# Patient Record
Sex: Female | Born: 1955 | Race: Black or African American | Hispanic: No | Marital: Single | Smoking: Current every day smoker
Health system: Southern US, Community
[De-identification: ages and names within clinical notes are randomized; demographics above are authoritative.]

## PROBLEM LIST (undated history)

## (undated) DIAGNOSIS — J45909 Unspecified asthma, uncomplicated: Secondary | ICD-10-CM

## (undated) DIAGNOSIS — J40 Bronchitis, not specified as acute or chronic: Secondary | ICD-10-CM

## (undated) HISTORY — PX: APPENDECTOMY: SHX54

---

## 2016-07-31 ENCOUNTER — Emergency Department (HOSPITAL_COMMUNITY): Payer: Self-pay

## 2016-07-31 ENCOUNTER — Emergency Department (HOSPITAL_COMMUNITY)
Admission: EM | Admit: 2016-07-31 | Discharge: 2016-07-31 | Disposition: A | Discharge status: Home / Self Care | Payer: Self-pay | Attending: Emergency Medicine | Admitting: Emergency Medicine

## 2016-07-31 ENCOUNTER — Encounter (HOSPITAL_COMMUNITY): Payer: Self-pay | Admitting: Emergency Medicine

## 2016-07-31 DIAGNOSIS — F17210 Nicotine dependence, cigarettes, uncomplicated: Secondary | ICD-10-CM | POA: Insufficient documentation

## 2016-07-31 DIAGNOSIS — Z91040 Latex allergy status: Secondary | ICD-10-CM | POA: Insufficient documentation

## 2016-07-31 DIAGNOSIS — R112 Nausea with vomiting, unspecified: Secondary | ICD-10-CM | POA: Insufficient documentation

## 2016-07-31 DIAGNOSIS — J45909 Unspecified asthma, uncomplicated: Secondary | ICD-10-CM | POA: Insufficient documentation

## 2016-07-31 HISTORY — DX: Unspecified asthma, uncomplicated: J45.909

## 2016-07-31 HISTORY — DX: Bronchitis, not specified as acute or chronic: J40

## 2016-07-31 LAB — CBC
HEMATOCRIT: 36.4 % (ref 36.0–46.0)
HEMOGLOBIN: 12.6 g/dL (ref 12.0–15.0)
MCH: 32.8 pg (ref 26.0–34.0)
MCHC: 34.6 g/dL (ref 30.0–36.0)
MCV: 94.8 fL (ref 78.0–100.0)
PLATELETS: 276 10*3/uL (ref 150–400)
RBC: 3.84 MIL/uL — AB (ref 3.87–5.11)
RDW: 12.4 % (ref 11.5–15.5)
WBC: 5.5 10*3/uL (ref 4.0–10.5)

## 2016-07-31 LAB — BASIC METABOLIC PANEL
ANION GAP: 13 (ref 5–15)
BUN: 9 mg/dL (ref 6–20)
CHLORIDE: 100 mmol/L — AB (ref 101–111)
CO2: 20 mmol/L — AB (ref 22–32)
CREATININE: 0.72 mg/dL (ref 0.44–1.00)
Calcium: 9.3 mg/dL (ref 8.9–10.3)
GFR calc non Af Amer: >60 mL/min (ref >60)
Glucose, Bld: 128 mg/dL — ABNORMAL HIGH (ref 65–99)
POTASSIUM: 3.3 mmol/L — AB (ref 3.5–5.1)
SODIUM: 133 mmol/L — AB (ref 135–145)

## 2016-07-31 LAB — URINALYSIS, ROUTINE W REFLEX MICROSCOPIC
BILIRUBIN URINE: NEGATIVE
Glucose, UA: NEGATIVE mg/dL
Hgb urine dipstick: NEGATIVE
KETONES UR: 20 mg/dL — AB
Leukocytes, UA: NEGATIVE
NITRITE: NEGATIVE
PH: 8 (ref 5.0–8.0)
Protein, ur: NEGATIVE mg/dL
Specific Gravity, Urine: 1.012 (ref 1.005–1.030)

## 2016-07-31 LAB — CBG MONITORING, ED: GLUCOSE-CAPILLARY: 128 mg/dL — AB (ref 65–99)

## 2016-07-31 LAB — TROPONIN I
Troponin I: <0.03 ng/mL (ref <0.03)
Troponin I: <0.03 ng/mL (ref <0.03)

## 2016-07-31 LAB — LIPASE, BLOOD: Lipase: 17 U/L (ref 11–51)

## 2016-07-31 MED ORDER — ONDANSETRON HCL 4 MG/2ML IJ SOLN
4.0000 mg | Freq: Once | INTRAMUSCULAR | Status: AC
  Administered 2016-07-31: 4 mg via INTRAVENOUS
  Filled 2016-07-31: qty 2

## 2016-07-31 MED ORDER — SODIUM CHLORIDE 0.9 % IV BOLUS (SEPSIS)
1000.0000 mL | Freq: Once | INTRAVENOUS | Status: AC
  Administered 2016-07-31: 1000 mL via INTRAVENOUS

## 2016-07-31 MED ORDER — GI COCKTAIL ~~LOC~~
30.0000 mL | Freq: Once | ORAL | Status: AC
  Administered 2016-07-31: 30 mL via ORAL
  Filled 2016-07-31: qty 30

## 2016-07-31 NOTE — ED Provider Notes (Signed)
[facility]-EMERGENCY DEPT Provider Note   CSN: [id number] Arrival date & time: 07/31/16  0411     History   Chief Complaint Chief Complaint  Patient presents with  . Emesis  . Shortness of Breath  . Weakness    HPI Tara Moore is a 60 y.o. female.  Patient presents via EMS with nausea and vomiting that woke her from sleep. States she has been vomiting about 3 times today and felt too weak to get out of bed so she called EMS. Denies diarrhea. Denies fever but has had chills. Has had a cough and cold for the past 2 weeks and been taking Mucinex. She denies any chest pain shortness of breath abdominal pain, back pain. Denies any hematuria or dysuria. Denies sick contacts. Denies any recent travel. Denies any suspicious food intake. Previous history of appendectomy. Also history of bronchitis. EMS concerned for peak T waves on EKG.   The history is provided by the patient and the EMS personnel.  Emesis   Associated symptoms include abdominal pain, arthralgias and myalgias. Pertinent negatives include no diarrhea, no fever and no headaches.  Shortness of Breath  Associated symptoms include vomiting and abdominal pain. Pertinent negatives include no fever, no headaches, no rhinorrhea, no neck pain and no chest pain.  Weakness  Primary symptoms include no dizziness. Associated symptoms include shortness of breath and vomiting. Pertinent negatives include no chest pain and no headaches.    Past Medical History:  Diagnosis Date  . Asthma   . Bronchitis     There are no active problems to display for this patient.   Past Surgical History:  Procedure Laterality Date  . APPENDECTOMY      OB History    No data available       Home Medications    Prior to Admission medications   Not on File    Family History No family history on file.  Social History Social History  Substance Use Topics  . Smoking status: Current Every Day Smoker    Packs/day: 0.50    Types:  Cigarettes  . Smokeless tobacco: Never Used  . Alcohol use 1.2 oz/week    2 Cans of beer per week     Comment: occasionally     Allergies   Latex   Review of Systems Review of Systems  Constitutional: Positive for activity change, appetite change and fatigue. Negative for fever.  HENT: Negative for congestion and rhinorrhea.   Eyes: Negative for photophobia.  Respiratory: Positive for shortness of breath.   Cardiovascular: Negative for chest pain.  Gastrointestinal: Positive for abdominal pain, nausea and vomiting. Negative for diarrhea.  Genitourinary: Negative for dysuria and hematuria.  Musculoskeletal: Positive for arthralgias and myalgias. Negative for neck pain and neck stiffness.  Skin: Negative for wound.  Neurological: Positive for weakness. Negative for dizziness and headaches.   A complete 10 system review of systems was obtained and all systems are negative except as noted in the HPI and PMH.    Physical Exam Updated Vital Signs BP 185/99 (BP Location: Right Arm)   Pulse 70   Temp 97.6 F (36.4 C) (Oral)   Resp 25   Ht 5\' 9"  (1.753 m)   Wt 125 lb (56.7 kg)   SpO2 100%   BMI 18.46 kg/m   Physical Exam  Constitutional: She is oriented to person, place, and time. She appears well-developed and well-nourished. No distress.  HENT:  Head: Normocephalic and atraumatic.  Mouth/Throat: Oropharynx is clear and moist.  No oropharyngeal exudate.  Moist mucus membranes  Eyes: Conjunctivae and EOM are normal. Pupils are equal, round, and reactive to light.  Neck: Normal range of motion. Neck supple.  No meningismus.  Cardiovascular: Normal rate, regular rhythm, normal heart sounds and intact distal pulses.   No murmur heard. Pulmonary/Chest: Effort normal and breath sounds normal. No respiratory distress.  Abdominal: Soft. There is no tenderness. There is no rebound and no guarding.  Musculoskeletal: Normal range of motion. She exhibits no edema or tenderness.    Neurological: She is alert and oriented to person, place, and time. No cranial nerve deficit. She exhibits normal muscle tone. Coordination normal.   5/5 strength throughout. CN 2-12 intact.Equal grip strength.   Skin: Skin is warm.  Psychiatric: She has a normal mood and affect. Her behavior is normal.  Nursing note and vitals reviewed.    ED Treatments / Results  Labs (all labs ordered are listed, but only abnormal results are displayed) Labs Reviewed  BASIC METABOLIC PANEL - Abnormal; Notable for the following:       Result Value   Sodium 133 (*)    Potassium 3.3 (*)    Chloride 100 (*)    CO2 20 (*)    Glucose, Bld 128 (*)    All other components within normal limits  CBC - Abnormal; Notable for the following:    RBC 3.84 (*)    All other components within normal limits  URINALYSIS, ROUTINE W REFLEX MICROSCOPIC - Abnormal; Notable for the following:    Color, Urine STRAW (*)    Ketones, ur 20 (*)    All other components within normal limits  CBG MONITORING, ED - Abnormal; Notable for the following:    Glucose-Capillary 128 (*)    All other components within normal limits  LIPASE, BLOOD  TROPONIN I  TROPONIN I    EKG  EKG Interpretation  Date/Time:  Saturday July 31 2016 08:46:17 EST Ventricular Rate:  66 PR Interval:    QRS Duration: 87 QT Interval:  424 QTC Calculation: 445 R Axis:   63 Text Interpretation:  Sinus rhythm Normal ECG Confirmed by Gerhard Munch  MD ([id number]) on 07/31/2016 8:51:39 AM       Radiology Dg Chest 2 View  Result Date: 07/31/2016 CLINICAL DATA:  Shortness of breath and nausea and vomiting EXAM: CHEST  2 VIEW COMPARISON:  None. FINDINGS: Cardiomediastinal contours are normal. There is bibasilar atelectasis. No focal airspace consolidation or pulmonary edema. No pleural effusion or pneumothorax. IMPRESSION: No active cardiopulmonary disease. Electronically Signed   By: Deatra Robinson M.D.   On: 07/31/2016 06:04     Procedures Procedures (including critical care time)  Medications Ordered in ED Medications  sodium chloride 0.9 % bolus 1,000 mL (not administered)  ondansetron (ZOFRAN) injection 4 mg (not administered)     Initial Impression / Assessment and Plan / ED Course  I have reviewed the triage vital signs and the nursing notes.  Pertinent labs & imaging results that were available during my care of the patient were reviewed by me and considered in my medical decision making (see chart for details).  Clinical Course   Patient with vomiting and nausea and generalized weakness with onset this evening. Denies pain.  Patient given IV fluids and IV antiemetics. Her lab work is reassuring. She has no abdominal pain.  She is tolerating by mouth. On recheck she complains of some chest tightness. First troponin is negative. We'll obtain some second troponin and repeat  EKG. BP elevated without history of same. She has been recently using mucinex.  Suspect chest tightness due to GI issues. Repeat EKG obtained. Second troponin pending.  PAtient tolerating PO. She is aware of elevated BP.  Dr. Jeraldine LootsLockwood to assume care and discharge pending second troponin.  Final Clinical Impressions(s) / ED Diagnoses   Final diagnoses:  Non-intractable vomiting with nausea, unspecified vomiting type    New Prescriptions New Prescriptions   No medications on file     Glynn OctaveStephen Lamarius Dirr, MD 07/31/16 [phone]

## 2016-07-31 NOTE — Discharge Instructions (Signed)
As discussed, your evaluation today has been largely reassuring.  But, it is important that you monitor your condition carefully, and do not hesitate to return to the ED if you develop new, or concerning changes in your condition. ? ?Otherwise, please follow-up with your physician for appropriate ongoing care. ? ?

## 2016-07-31 NOTE — ED Notes (Signed)
Lab contacted to add on troponin

## 2016-07-31 NOTE — ED Notes (Signed)
Pt informed of the need for a urine specimen

## 2016-07-31 NOTE — ED Triage Notes (Signed)
Per [facility]   Pt coming from home.   Cold symptoms 1.5 weeks ago. Mucinex did not help. N/V started last night. Fever for the last several days.Chills started today and feeling like she couldn't do anything and inc weakness.. No LOC. Pt refused Zofran with EMS.  12 lead showed peaked T waves.

## 2016-07-31 NOTE — ED Notes (Signed)
Papers reviewed and paper scrubs given to patient. IV removed and patient going home with family member

## 2016-07-31 NOTE — ED Notes (Signed)
Pt refused to use wheel chair to go the restroom

## 2016-07-31 NOTE — ED Notes (Signed)
Checked CBG 128, RN Jazmine informed

## 2017-06-28 IMAGING — CR DG CHEST 2V
2 series · 2 of 2 positions shown · non-contrast
Comparison: None.

CLINICAL DATA: Shortness of breath and nausea and vomiting

EXAM:
CHEST  2 VIEW

[chest lat]
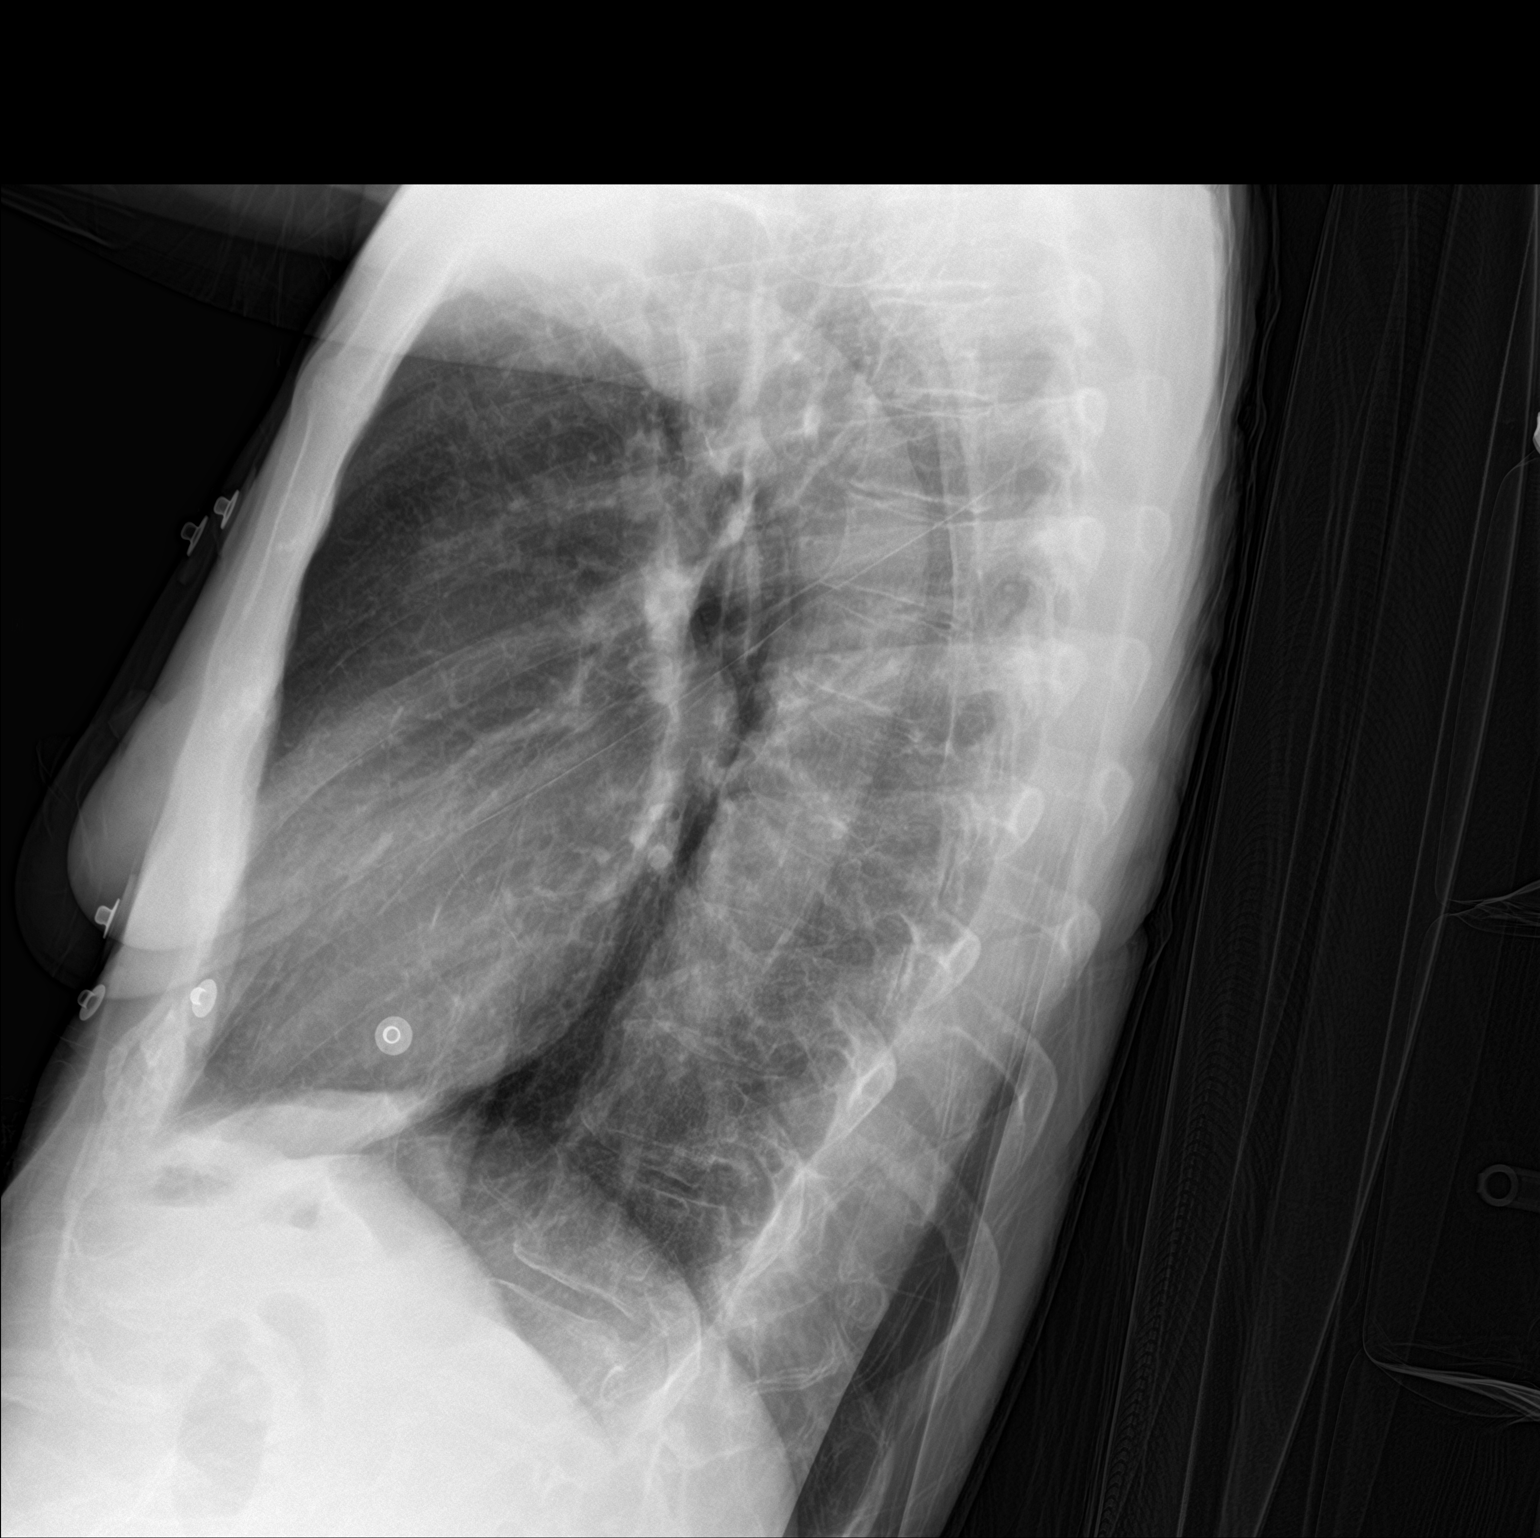

[chest ap]
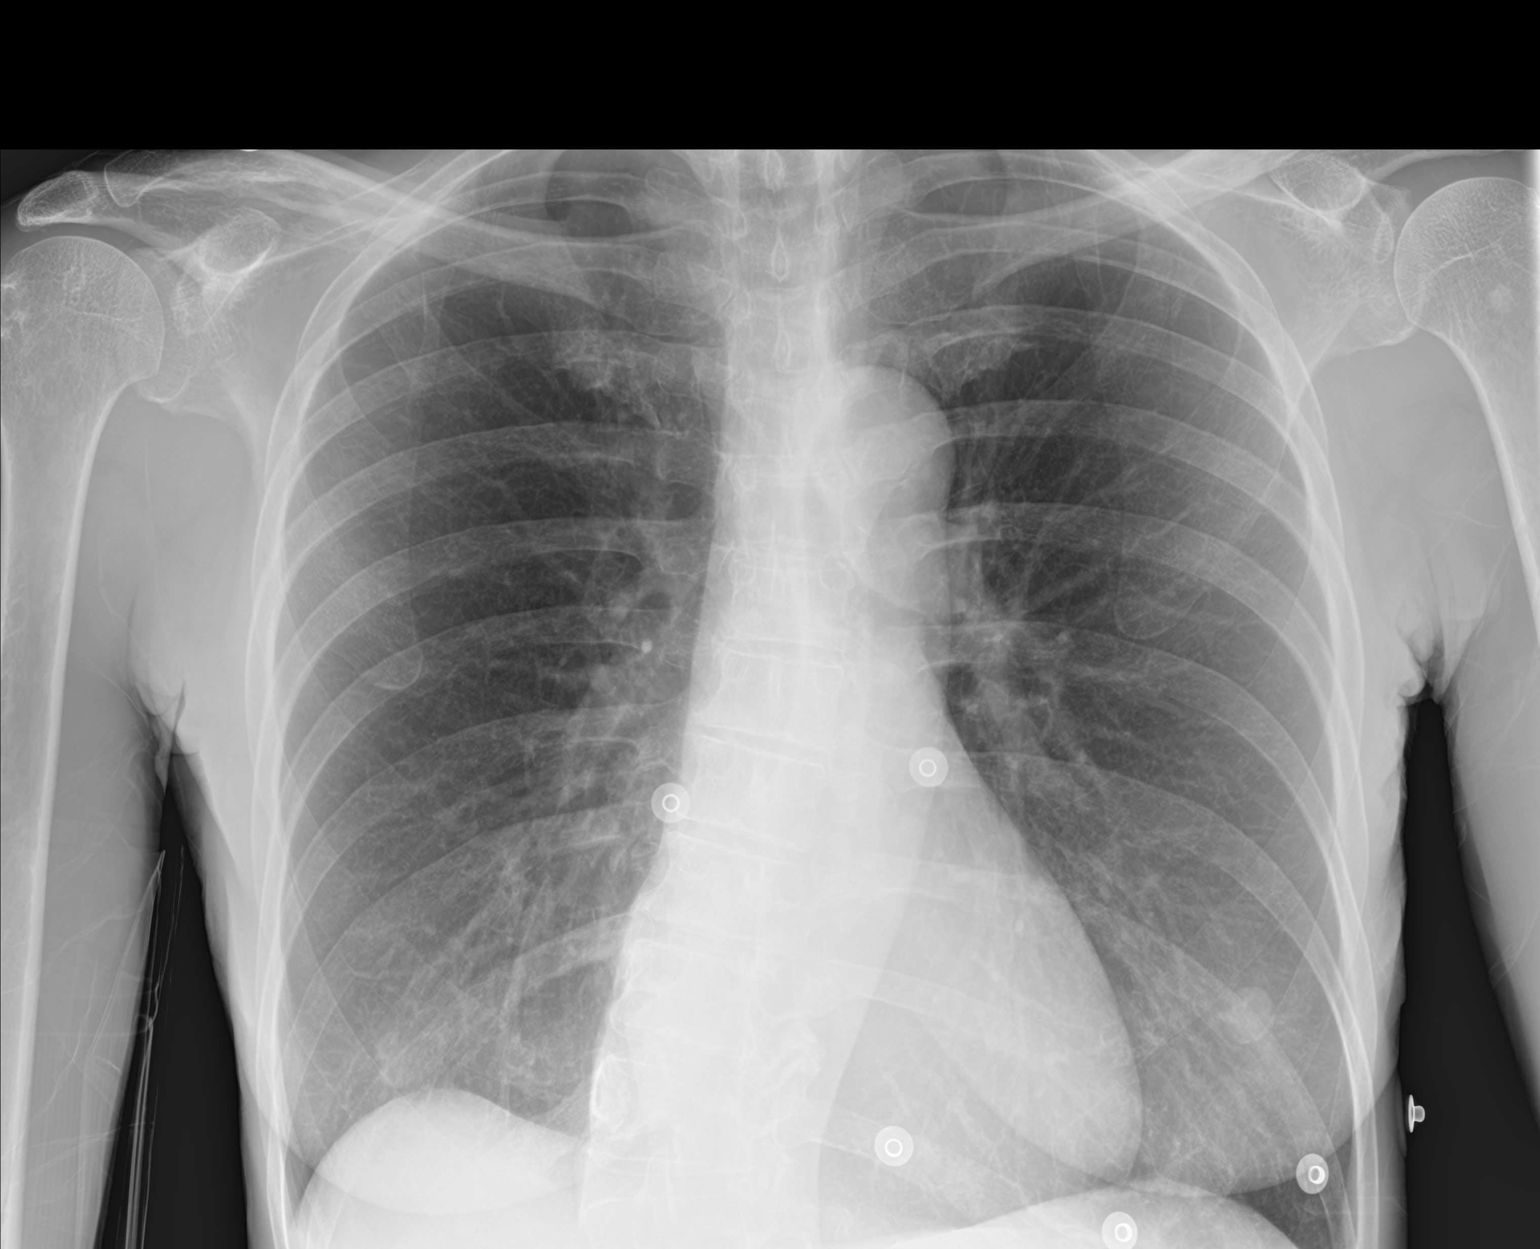

[2 of 2 positions shown; findings below may reference images not displayed]

FINDINGS: Cardiomediastinal contours are normal. There is bibasilar
atelectasis. No focal airspace consolidation or pulmonary edema. No
pleural effusion or pneumothorax.
IMPRESSION: No active cardiopulmonary disease.

## 2021-05-09 DEATH — deceased
